# Patient Record
Sex: Male | Born: 1995 | Race: White | Hispanic: No | Marital: Single | Smoking: Never smoker
Health system: Southern US, Community
[De-identification: ages and names within clinical notes are randomized; demographics above are authoritative.]

---
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
## 2014-03-28 ENCOUNTER — Ambulatory Visit: Payer: Self-pay | Admitting: Family Medicine

## 2014-03-30 ENCOUNTER — Ambulatory Visit: Payer: Self-pay | Admitting: Family Medicine

## 2015-08-22 ENCOUNTER — Ambulatory Visit
Admission: RE | Admit: 2015-08-22 | Discharge: 2015-08-22 | Disposition: A | Discharge status: Home / Self Care | Payer: BLUE CROSS/BLUE SHIELD | Source: Ambulatory Visit | Attending: Family Medicine | Admitting: Family Medicine

## 2015-08-22 ENCOUNTER — Other Ambulatory Visit: Payer: Self-pay | Admitting: Family Medicine

## 2015-08-22 ENCOUNTER — Encounter: Payer: Self-pay | Admitting: Family Medicine

## 2015-08-22 ENCOUNTER — Ambulatory Visit (INDEPENDENT_AMBULATORY_CARE_PROVIDER_SITE_OTHER): Payer: BLUE CROSS/BLUE SHIELD | Admitting: Family Medicine

## 2015-08-22 DIAGNOSIS — S8991XA Unspecified injury of right lower leg, initial encounter: Secondary | ICD-10-CM | POA: Diagnosis not present

## 2015-08-22 DIAGNOSIS — R609 Edema, unspecified: Secondary | ICD-10-CM

## 2015-08-22 DIAGNOSIS — M25561 Pain in right knee: Secondary | ICD-10-CM | POA: Diagnosis present

## 2015-08-22 DIAGNOSIS — R52 Pain, unspecified: Secondary | ICD-10-CM

## 2015-08-22 NOTE — Progress Notes (Signed)
Patient ID: Lance Horton, male   DOB: 10/16/1995, 20 y.o.   MRN: [id number]   Patient presents today with symptoms of right knee pain and swelling. He states that he initially injured the knee last summer. He remembers diving for a ball, as he is the goalie, and when he got up he noticed the knee pop. He did have swelling of the knee at that time and treated it conservatively with ice and rest. The symptoms improved and he was able to continue playing soccer for the fall season. Recently, last week, he did the same thing again playing soccer and he again had popping and swelling of the right knee. He also noticed it getting up from lying on the couch a few days ago as well. He states that he has limitation with full flexion of the right knee but has full extension.  ROS: Negative except mentioned above.  Vitals as per Epic.  GENERAL: NAD RESP: CTA B CARD: RRR MSK: R Knee- minimal effusion, FROM, mild tenderness along lateral jointline, +McMurry, -Lachman, -Drawer, no laxity with varus/valgus stress, nv intact NEURO: CN II-XII grossly intact   A/P: Right knee injury- will get x-rays in order an MRI of the right knee to evaluate for meniscal tear, NSAIDs when necessary, will see Dr. Ardine Engiehl once MRI results have been reviewed. Will discuss plans with trainer.

## 2015-08-23 ENCOUNTER — Ambulatory Visit
Admission: RE | Admit: 2015-08-23 | Discharge: 2015-08-23 | Disposition: A | Discharge status: Home / Self Care | Payer: BLUE CROSS/BLUE SHIELD | Source: Ambulatory Visit | Attending: Family Medicine | Admitting: Family Medicine

## 2015-08-23 DIAGNOSIS — M25861 Other specified joint disorders, right knee: Secondary | ICD-10-CM | POA: Diagnosis not present

## 2015-08-23 DIAGNOSIS — M25561 Pain in right knee: Secondary | ICD-10-CM | POA: Diagnosis present

## 2015-08-23 DIAGNOSIS — S83261A Peripheral tear of lateral meniscus, current injury, right knee, initial encounter: Secondary | ICD-10-CM | POA: Insufficient documentation

## 2015-08-23 DIAGNOSIS — M660 Rupture of popliteal cyst: Secondary | ICD-10-CM | POA: Insufficient documentation

## 2015-09-04 HISTORY — PX: MENISCECTOMY: SHX123

## 2015-09-27 ENCOUNTER — Encounter: Payer: Self-pay | Admitting: Family Medicine

## 2015-09-27 ENCOUNTER — Ambulatory Visit (INDEPENDENT_AMBULATORY_CARE_PROVIDER_SITE_OTHER): Payer: BLUE CROSS/BLUE SHIELD | Admitting: Family Medicine

## 2015-09-27 VITALS — BP 121/70 | HR 82 | Temp 97.9°F | Resp 14

## 2015-09-27 DIAGNOSIS — J208 Acute bronchitis due to other specified organisms: Secondary | ICD-10-CM | POA: Diagnosis not present

## 2015-09-27 MED ORDER — AZITHROMYCIN 250 MG PO TABS: ORAL_TABLET | ORAL | Status: DC

## 2015-09-27 MED ORDER — PREDNISONE 20 MG PO TABS: ORAL_TABLET | ORAL | Status: AC

## 2015-09-27 NOTE — Addendum Note (Signed)
Addended by: Dione HousekeeperPATEL, Keely Drennan N on: 09/27/2015 12:33 PM   Modules accepted: Orders

## 2015-09-27 NOTE — Progress Notes (Signed)
Patient ID: Lance Horton, male   DOB: 10/25/1995, 20 y.o.   MRN: [id number]  Patient presents today with symptoms of mild productive cough of yellow sputum. Patient states that his symptoms started about 3 weeks ago. He denies any history of allergy symptoms or asthma. He denies any history of smoking. He has been taking Sudafed for his symptoms. He denies any fever, chest pain, shortness of breath, night sweats, severe headache. He denies any other symptoms. He is currently only doing rehab from his meniscal surgery.  ROS: Negative except mentioned above.  GENERAL: NAD HEENT: mild pharyngeal erythema, no exudate, no erythema of TMs, no cervical LAD RESP: minimally decreased breath sounds at bases, no rales, rhonci, or wheezing appreciated  CARD: RRR NEURO: CN II-XII grossly intact   A/P: Bronchitis- will treat with Z-Pak, short course of prednisone, Delsym when necessary, Claritin when necessary, seek medical attention if symptoms persist or worsen as discussed.

## 2016-03-10 ENCOUNTER — Ambulatory Visit (INDEPENDENT_AMBULATORY_CARE_PROVIDER_SITE_OTHER): Payer: BLUE CROSS/BLUE SHIELD | Admitting: Family Medicine

## 2016-03-10 ENCOUNTER — Encounter: Payer: Self-pay | Admitting: Family Medicine

## 2016-03-10 VITALS — BP 99/70 | HR 78 | Temp 97.0°F

## 2016-03-10 DIAGNOSIS — R0981 Nasal congestion: Secondary | ICD-10-CM | POA: Diagnosis not present

## 2016-03-10 NOTE — Progress Notes (Signed)
Patient presents today with symptoms of nasal congestion, sinus pressure, fatigue. Patient states that he has had symptoms for the last 2 days. He denies any fever, cough, severe sore throat. He has been taking some over-the-counter medication given to him by his trainer. He denies any history of seasonal allergies.  ROS: Negative except mentioned above. Vitals as per Epic.  GENERAL: NAD HEENT: mild pharyngeal erythema, no exudate, no erythema of TMs, no cervical LAD RESP: CTA B CARD: RRR NEURO: CN II-XII grossly intact   A/P: Nasal Congestion- Claritin-D prn, Ibuprofen, rest, hydration, seek medical attention if symptoms persist or worsen.

## 2016-07-06 ENCOUNTER — Encounter: Payer: Self-pay | Admitting: Family Medicine

## 2016-07-06 ENCOUNTER — Ambulatory Visit (INDEPENDENT_AMBULATORY_CARE_PROVIDER_SITE_OTHER): Payer: BLUE CROSS/BLUE SHIELD | Admitting: Family Medicine

## 2016-07-06 VITALS — BP 116/66 | HR 65 | Temp 98.1°F

## 2016-07-06 DIAGNOSIS — S0181XA Laceration without foreign body of other part of head, initial encounter: Secondary | ICD-10-CM

## 2016-07-06 MED ORDER — AMOXICILLIN 500 MG PO CAPS: 500.0000 mg | ORAL_CAPSULE | Freq: Two times a day (BID) | ORAL | 0 refills | Status: AC

## 2016-07-06 NOTE — Progress Notes (Signed)
Patient presents today with laceration to the left side of face. Patient states that teammates cleat went into his face earlier today. Patient denies any symptoms of concussion. He denies any headache, nausea, vomiting, visual problems. The trainer clean the wound and put Steri-Strips over the area earlier today. Patient denies any other problems.  ROS: Negative except mentioned above. Vitals as per Epic. GENERAL: NAD HEENT: PERRL, EOMI SKIN: Approximately 1 inch laceration (not deep) noted above left eyebrow, mild bleeding from site after Steri-Strips removed. NEURO: CN II-XII grossly intact   A/P: Laceration face - wound was cleaned with hydrogen peroxide after Steri-Strips were removed, the wound was dried and then Dermabond was placed, fairly good approximation of the skin was made, discussed with patient to keep area dry for the next few days, prophylactic antibiotic will be given for a few days given the fact that wound was by cleat and patient was seen several hours after the incident, discussed with patient that the laceration will likely cause a scar however can minimize scar possibly with Mederma. Encouraged always using sunscreen when outdoors on area to minimize scarring. Patient states tetnaus immunization is up-to-date <5 years. Monitor for any signs of concussion. Seek medical attention if any further problems as discussed.

## 2016-07-27 ENCOUNTER — Ambulatory Visit (INDEPENDENT_AMBULATORY_CARE_PROVIDER_SITE_OTHER): Payer: BLUE CROSS/BLUE SHIELD | Admitting: Family Medicine

## 2016-07-27 ENCOUNTER — Encounter: Payer: Self-pay | Admitting: Family Medicine

## 2016-07-27 ENCOUNTER — Ambulatory Visit
Admission: RE | Admit: 2016-07-27 | Discharge: 2016-07-27 | Disposition: A | Discharge status: Home / Self Care | Payer: BLUE CROSS/BLUE SHIELD | Source: Ambulatory Visit | Attending: Family Medicine | Admitting: Family Medicine

## 2016-07-27 DIAGNOSIS — X58XXXD Exposure to other specified factors, subsequent encounter: Secondary | ICD-10-CM | POA: Diagnosis not present

## 2016-07-27 DIAGNOSIS — M25532 Pain in left wrist: Secondary | ICD-10-CM

## 2016-07-27 DIAGNOSIS — S62002D Unspecified fracture of navicular [scaphoid] bone of left wrist, subsequent encounter for fracture with routine healing: Secondary | ICD-10-CM | POA: Diagnosis not present

## 2016-07-27 NOTE — Progress Notes (Addendum)
Patient presents today with symptoms of left wrist pain. Patient states that his wrist extended back when trying to block/catch a soccer ball. He states that he had pain right away after the incident. He did experience some bruising of the area. He has been in a thumb spica splint 98% of the time since the injury approximately 1.5 weeks ago. He states that the pain has decreased and the range of motion has improved. He still however does have discomfort with certain movements. He denies any injury to the wrist in the past. He has been taking Ibuprofen on occasion.  ROS: Negative except mentioned above.  GENERAL: NAD MSK: L Wrist- no deformity appreciated, minimal ecchymosis on the palmer aspect of the wrist/hand, mild decreased range of motion with full extension and flexion of the wrist, mild tenderness along the radial aspect of the wrist, mild tenderness at snuff box, nv intact  NEURO: CN II-XII grossly intact   A/P: Left Wrist Injury - will get Xrays, will discuss treatment plan with trainer once Xrays reviewed, Aleve prn, seek medical attention if symptoms persist or worsen.

## 2016-07-28 ENCOUNTER — Other Ambulatory Visit: Payer: Self-pay | Admitting: Family Medicine

## 2016-07-28 DIAGNOSIS — S62002A Unspecified fracture of navicular [scaphoid] bone of left wrist, initial encounter for closed fracture: Secondary | ICD-10-CM

## 2016-07-29 ENCOUNTER — Ambulatory Visit
Admission: RE | Admit: 2016-07-29 | Discharge: 2016-07-29 | Disposition: A | Discharge status: Home / Self Care | Payer: BLUE CROSS/BLUE SHIELD | Source: Ambulatory Visit | Attending: Family Medicine | Admitting: Family Medicine

## 2016-07-29 DIAGNOSIS — T148XXA Other injury of unspecified body region, initial encounter: Secondary | ICD-10-CM | POA: Diagnosis not present

## 2016-07-29 DIAGNOSIS — X58XXXA Exposure to other specified factors, initial encounter: Secondary | ICD-10-CM | POA: Diagnosis not present

## 2016-07-29 DIAGNOSIS — S62002A Unspecified fracture of navicular [scaphoid] bone of left wrist, initial encounter for closed fracture: Secondary | ICD-10-CM

## 2016-09-10 ENCOUNTER — Other Ambulatory Visit: Payer: Self-pay | Admitting: Family Medicine

## 2016-09-10 DIAGNOSIS — S62009D Unspecified fracture of navicular [scaphoid] bone of unspecified wrist, subsequent encounter for fracture with routine healing: Secondary | ICD-10-CM

## 2016-09-11 ENCOUNTER — Ambulatory Visit
Admission: RE | Admit: 2016-09-11 | Discharge: 2016-09-11 | Disposition: A | Discharge status: Home / Self Care | Payer: BLUE CROSS/BLUE SHIELD | Source: Ambulatory Visit | Attending: Family Medicine | Admitting: Family Medicine

## 2016-09-11 DIAGNOSIS — S62009D Unspecified fracture of navicular [scaphoid] bone of unspecified wrist, subsequent encounter for fracture with routine healing: Secondary | ICD-10-CM

## 2016-09-11 DIAGNOSIS — X58XXXD Exposure to other specified factors, subsequent encounter: Secondary | ICD-10-CM | POA: Insufficient documentation

## 2017-04-15 ENCOUNTER — Encounter: Payer: Self-pay | Admitting: Family Medicine

## 2017-04-15 ENCOUNTER — Ambulatory Visit (INDEPENDENT_AMBULATORY_CARE_PROVIDER_SITE_OTHER): Payer: BLUE CROSS/BLUE SHIELD | Admitting: Family Medicine

## 2017-04-15 ENCOUNTER — Ambulatory Visit: Payer: BLUE CROSS/BLUE SHIELD | Admitting: Family Medicine

## 2017-04-15 VITALS — BP 127/71 | HR 67 | Temp 97.8°F | Resp 14

## 2017-04-15 DIAGNOSIS — J069 Acute upper respiratory infection, unspecified: Secondary | ICD-10-CM

## 2017-04-15 MED ORDER — AZITHROMYCIN 250 MG PO TABS: ORAL_TABLET | ORAL | 0 refills | Status: AC

## 2017-04-15 NOTE — Progress Notes (Signed)
Patient presents today with symptoms of mild productive cough, nasal congestion. Patient states that he's had symptoms for the last week. He denies any abdominal symptoms, chest pain, shortness of breath, wheezing. He denies any night sweats or body aches. He states that the mucus is clear to yellow. Denies any history of asthma. Has been taking DayQuil intermittently.  ROS: Negative except mentioned above. Vitals as per Epic. GENERAL: NAD HEENT: mild pharyngeal erythema, no exudate, no erythema of TMs, no cervical LAD RESP: CTA B, no accessory muscle use, no wheezing appreciated CARD: RRR NEURO: CN II-XII grossly intact   A/P: URI - will treat with over-the-counter cough suppressant, Tylenol/Ibuprofen, Claritin when necessary, if mucus is more colored and symptoms worsen can start Z-Pak. No athletic activity or class if febrile. Seek medical attention if symptoms persist or worsen as discussed.

## 2017-06-11 ENCOUNTER — Other Ambulatory Visit: Payer: Self-pay | Admitting: Family Medicine

## 2017-06-11 DIAGNOSIS — M25511 Pain in right shoulder: Secondary | ICD-10-CM

## 2017-06-11 DIAGNOSIS — M25531 Pain in right wrist: Secondary | ICD-10-CM

## 2018-08-08 IMAGING — MR MR WRIST*L* W/O CM
6 series · 40 of 40 positions shown · non-contrast
Comparison: Plain films left wrist 07/29/2016.

CLINICAL DATA: Hyperextension injury left wrist playing soccer 2
weeks ago with bruising on the palm and anterior wrist. Scaphoid
fracture by prior plain films. Initial encounter.

EXAM:
MR OF THE LEFT WRIST WITHOUT CONTRAST
TECHNIQUE: Multiplanar, multisequence MR imaging of the left wrist was
performed. No intravenous contrast was administered.

[Series 7: T2 fat-sat · axial · 3.0mm · 0.43mm/px · z∈[+20,+88]mm · 8 of 23 slices shown (1 of 2)]
[im 1/23]
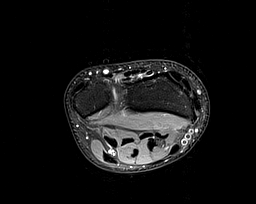
[im 4/23]
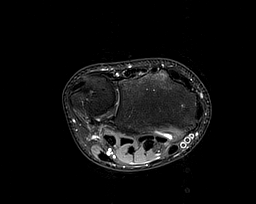
[im 7/23]
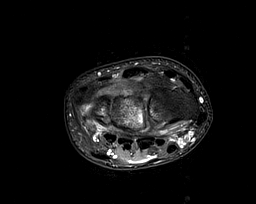
[im 10/23]
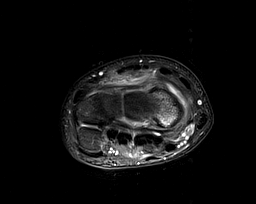
[im 13/23]
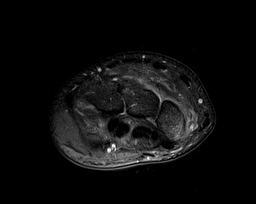
[im 16/23]
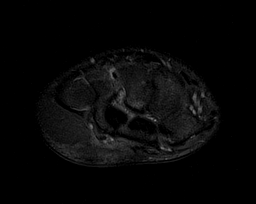
[im 19/23]
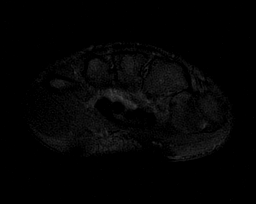
[im 23/23]
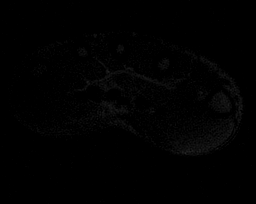

[Series 8: T1 · axial · 3.0mm · 0.43mm/px · z∈[+20,+88]mm · 7 of 23 slices shown (1 of 2)]
[im 1/23]
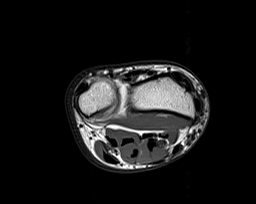
[im 4/23]
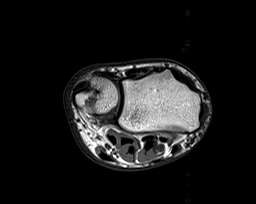
[im 8/23]
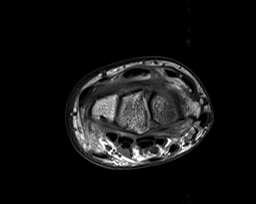
[im 12/23]
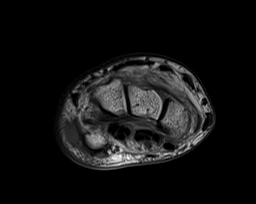
[im 15/23]
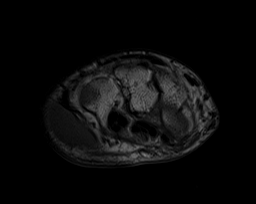
[im 19/23]
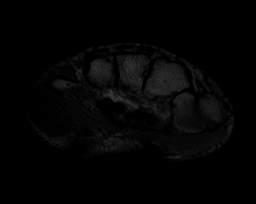
[im 23/23]
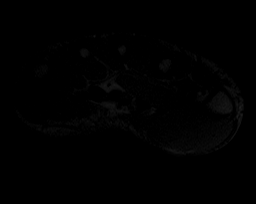

[Series 9: T1 · coronal · 3.0mm · 0.43mm/px · 6 of 19 slices shown (2 of 2)]
[im 1/19]
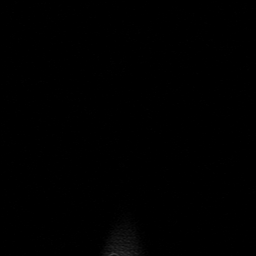
[im 4/19]
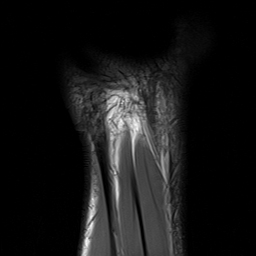
[im 8/19]
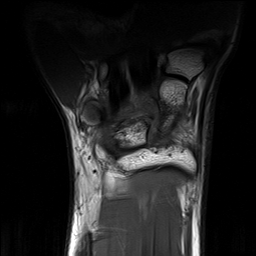
[im 11/19]
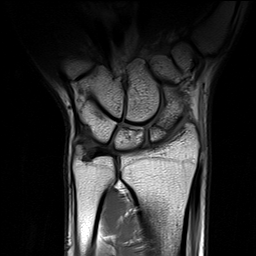
[im 15/19]
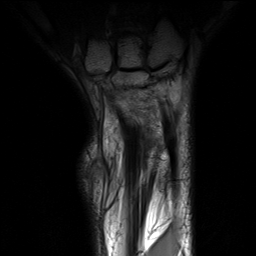
[im 19/19]
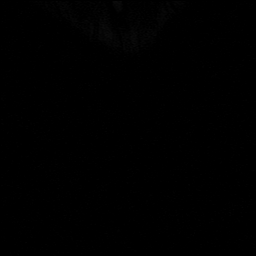

[Series 10: T2 fat-sat · coronal · 3.0mm · 0.43mm/px · 6 of 19 slices shown (2 of 2)]
[im 1/19]
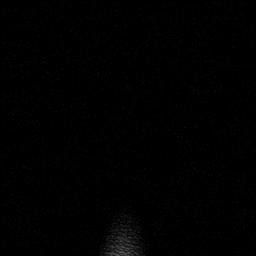
[im 4/19]
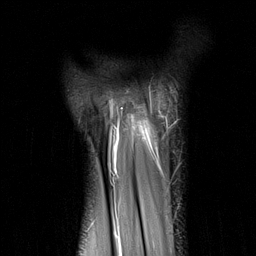
[im 8/19]
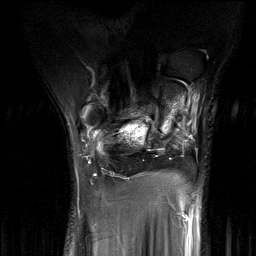
[im 11/19]
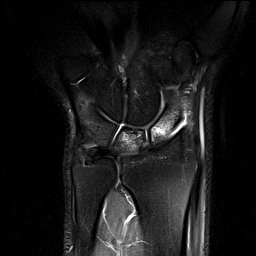
[im 15/19]
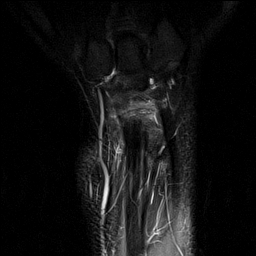
[im 19/19]
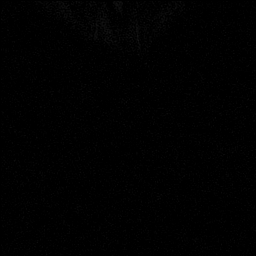

[Series 11: PD fat-sat · coronal · 3.0mm · 0.21mm/px · 6 of 19 slices shown (1 of 2)]
[im 1/19]
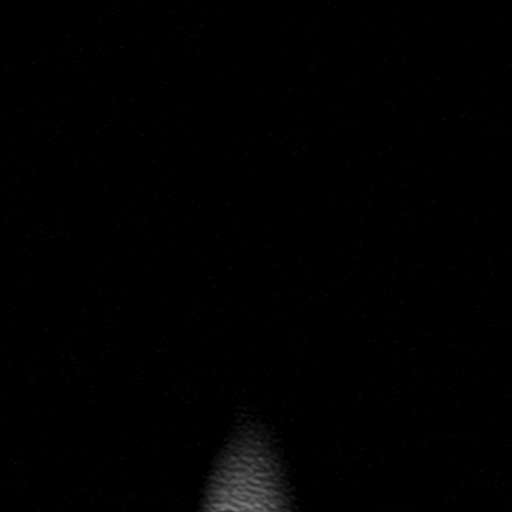
[im 4/19]
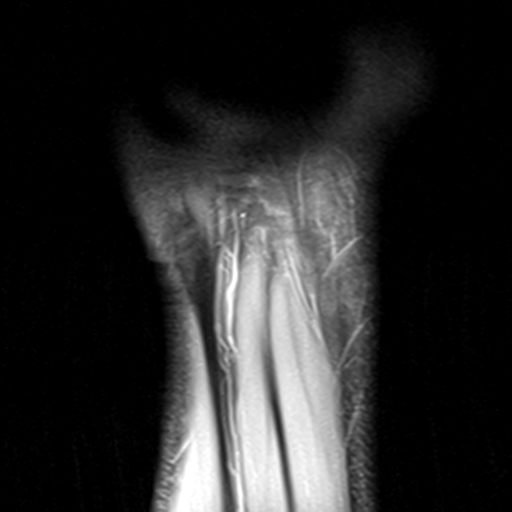
[im 8/19]
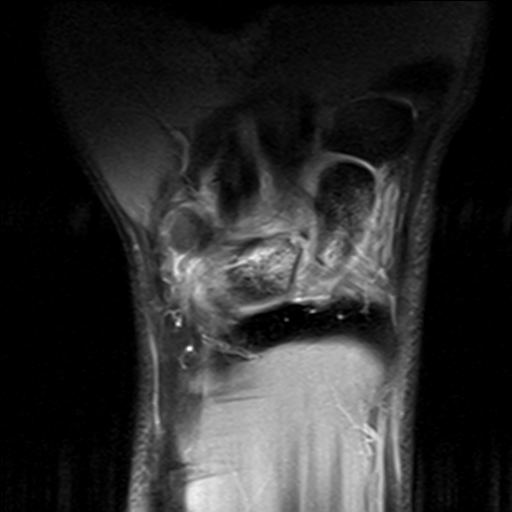
[im 11/19]
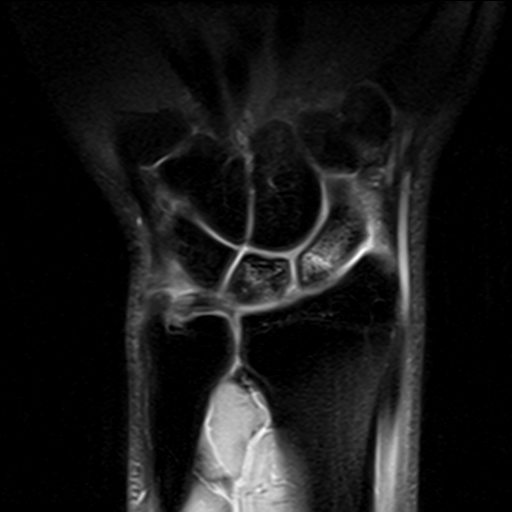
[im 15/19]
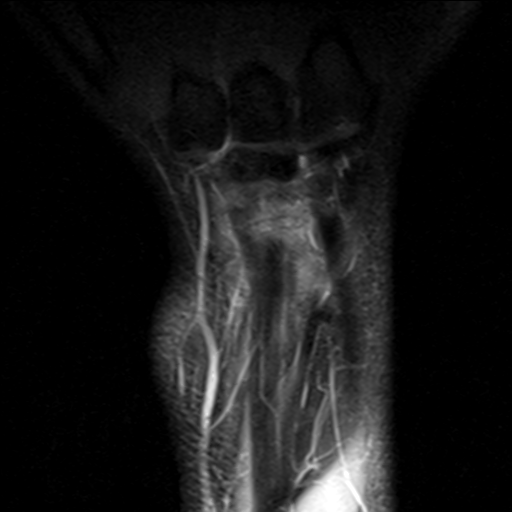
[im 19/19]
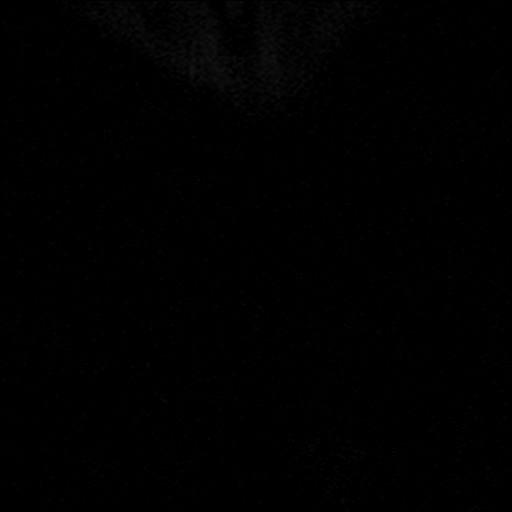

[Series 12: PD fat-sat · sagittal · 3.0mm · 0.21mm/px · 7 of 23 slices shown (2 of 2)]
[im 1/23]
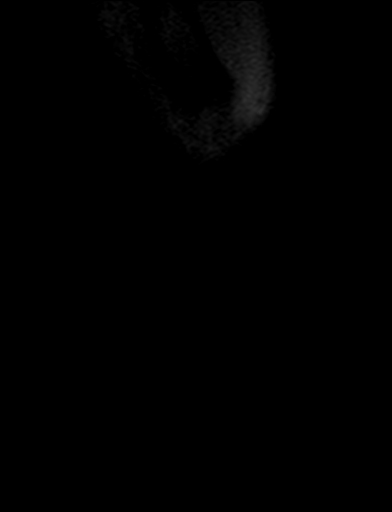
[im 4/23]
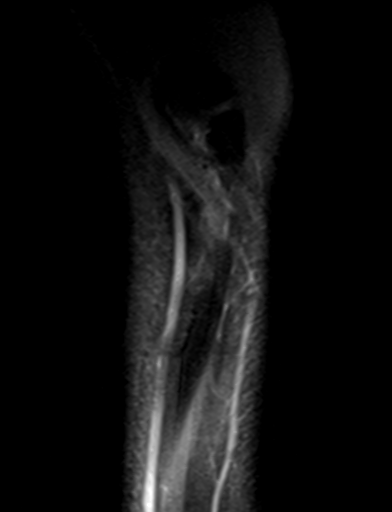
[im 8/23]
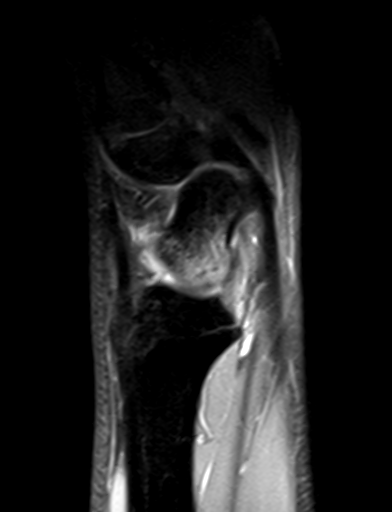
[im 12/23]
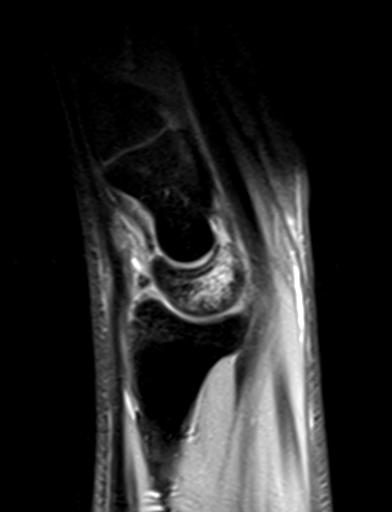
[im 15/23]
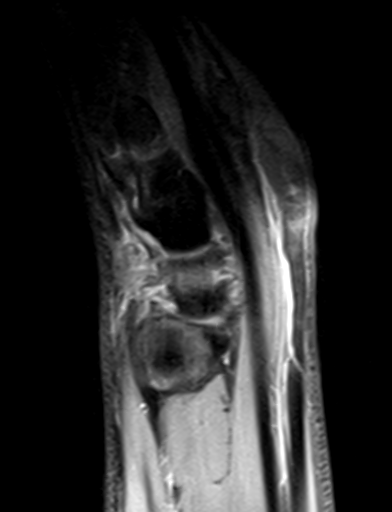
[im 19/23]
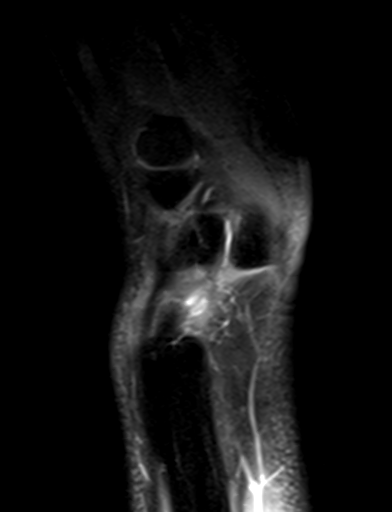
[im 23/23]
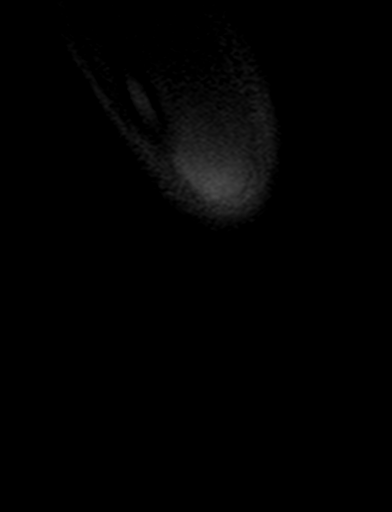

[40 of 40 positions shown; findings below may reference images not displayed]

FINDINGS: Ligaments: Intact.

Triangular fibrocartilage: Intact.

Tendons: Intact.

Carpal tunnel/median nerve: Normal carpal tunnel. Normal median
nerve.

Guyon's canal: Normal.

Joint/cartilage: No joint effusion. No chondral defect.

Bones/carpal alignment: The patient has a nondisplaced fracture
through the proximal most waist of the scaphoid with associated
marrow edema. Marrow edema throughout the lunate consistent with
contusion is identified. No fracture of the lunate is seen. Small
contusions are also seen in Lister's tubercle and in the radial
aspect of the triquetrum.

Other: None.
IMPRESSION: Study is positive for a nondisplaced acute fracture through the
proximal waist of the scaphoid.

Large bone contusion in the lunate without fracture identified. Very
small contusions in Lister's tubercle and the radial aspect of the
triquetrum are also seen.

Negative for tendon, ligament or TFC tear.

## 2018-12-15 DIAGNOSIS — M25561 Pain in right knee: Secondary | ICD-10-CM | POA: Diagnosis not present
